# Patient Record
Sex: Female | Born: 1990 | Race: Black or African American | Hispanic: No | Marital: Single | Smoking: Never smoker
Health system: Southern US, Community
[De-identification: ages and names within clinical notes are randomized; demographics above are authoritative.]

## PROBLEM LIST (undated history)

## (undated) DIAGNOSIS — Z789 Other specified health status: Secondary | ICD-10-CM

## (undated) DIAGNOSIS — D649 Anemia, unspecified: Secondary | ICD-10-CM

## (undated) HISTORY — PX: NO PAST SURGERIES: SHX2092

---
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
## 2010-01-18 ENCOUNTER — Emergency Department (HOSPITAL_COMMUNITY): Admission: EM | Admit: 2010-01-18 | Discharge: 2010-01-18 | Payer: Self-pay | Admitting: Emergency Medicine

## 2012-08-13 ENCOUNTER — Emergency Department
Admission: EM | Admit: 2012-08-13 | Discharge: 2012-08-13 | Disposition: A | Discharge status: Home / Self Care | Payer: 59 | Source: Home / Self Care | Attending: Family Medicine | Admitting: Family Medicine

## 2012-08-13 ENCOUNTER — Encounter: Payer: Self-pay | Admitting: Emergency Medicine

## 2012-08-13 DIAGNOSIS — Z331 Pregnant state, incidental: Secondary | ICD-10-CM

## 2012-08-13 DIAGNOSIS — R109 Unspecified abdominal pain: Secondary | ICD-10-CM

## 2012-08-13 DIAGNOSIS — Z3490 Encounter for supervision of normal pregnancy, unspecified, unspecified trimester: Secondary | ICD-10-CM

## 2012-08-13 HISTORY — DX: Anemia, unspecified: D64.9

## 2012-08-13 LAB — POCT URINALYSIS DIP (MANUAL ENTRY)
Glucose, UA: NEGATIVE
Ketones, POC UA: NEGATIVE
Spec Grav, UA: 1.02 (ref 1.005–1.03)

## 2012-08-13 LAB — POCT URINE PREGNANCY: Preg Test, Ur: POSITIVE

## 2012-08-13 NOTE — ED Notes (Signed)
Reports lower left abdominal cramping, especially when lying on left side at night. LMP is 07-04-12 and there is a chance of pregnancy. No vaginal spotting.

## 2012-08-13 NOTE — ED Provider Notes (Signed)
History     CSN: [id number]  Arrival date & time 08/13/12  1909   First MD Initiated Contact with Patient 08/13/12 2012      Chief Complaint  Patient presents with  . Abdominal Cramping      HPI Comments: Reports lower left abdominal cramping for one week, especially when lying on left side at night. LMP is 07-04-12 and there is a chance of pregnancy. No vaginal spotting.  No dysuria.  No fevers, chills, and sweats   Patient is a 21 y.o. female presenting with abdominal pain. The history is provided by the patient.  Abdominal Pain The primary symptoms of the illness include abdominal pain. Episode onset: 1 week ago. The onset of the illness was gradual. The problem has not changed since onset. Associated with: nothing. The patient has not had a change in bowel habit. Additional symptoms associated with the illness include back pain. Symptoms associated with the illness do not include chills, anorexia, diaphoresis, heartburn, constipation, urgency, hematuria or frequency.    Past Medical History  Diagnosis Date  . Anemia     History reviewed. No pertinent past surgical history.  History reviewed. No pertinent family history.  History  Substance Use Topics  . Smoking status: Never Smoker   . Smokeless tobacco: Not on file  . Alcohol Use: Yes    OB History    Grav Para Term Preterm Abortions TAB SAB Ect Mult Living                  Review of Systems  Constitutional: Negative for chills and diaphoresis.  Gastrointestinal: Positive for abdominal pain. Negative for heartburn, constipation and anorexia.  Genitourinary: Negative for urgency, frequency and hematuria.  Musculoskeletal: Positive for back pain.  All other systems reviewed and are negative.    Allergies  Review of patient's allergies indicates no known allergies.  Home Medications   Current Outpatient Rx  Name Route Sig Dispense Refill  . FERROUS FUMARATE 325 (106 FE) MG PO TABS Oral Take 1 tablet by  mouth.      BP 101/67  Pulse 87  Temp 98.4 F (36.9 C) (Oral)  Resp 16  Ht 5\' 4"  (1.626 m)  Wt 146 lb (66.225 kg)  BMI 25.06 kg/m2  SpO2 100%  LMP 07/04/2012  Physical Exam Nursing notes and Vital Signs reviewed. Appearance:  Patient appears healthy, stated age, and in no acute distress Eyes:  Pupils are equal, round, and reactive to light and accomodation.  Extraocular movement is intact.  Conjunctivae are not inflamed   Pharynx:  Normal Neck:  Supple. No adenopathy  Lungs:  Clear to auscultation.  Breath sounds are equal.  Heart:  Regular rate and rhythm without murmurs, rubs, or gallops.  Abdomen:  Nontender without masses or hepatosplenomegaly.  Bowel sounds are present.  No CVA or flank tenderness.  Extremities:  No edema.  No calf tenderness Skin:  No rash present.   Back:  nontender ED Course  Procedures none    Labs Reviewed  URINE CULTURE pending   Narrative:    Performed at:  [facility]                [address]                [place], [place] [address]  POCT URINALYSIS DI P (MANUAL ENTRY) trace blood, trace leuks  POCT URINE PREGNANCY positive      1. Abdominal pain  2. Pregnancy       MDM  Patient has benign exam; suspect intrauterine pregnancy.  No evidence on exam of ectopic pregnancy or other pathologic conditions. Recommend that she followup with OB/GYN as soon as possible.  Discussed red flags:  Vaginal bleeding, increasing abdominal pain, fever, etc. Will send urine for culture.        Lattie Haw, MD 08/15/12 [phone]

## 2012-08-15 ENCOUNTER — Telehealth: Payer: Self-pay | Admitting: *Deleted

## 2012-08-16 ENCOUNTER — Telehealth: Payer: Self-pay | Admitting: Emergency Medicine

## 2012-08-16 LAB — URINE CULTURE: Colony Count: >=100000

## 2012-12-19 NOTE — L&D Delivery Note (Signed)
Patient was C/C/+2 and pushed for approx 1hr with epidural.   NSVD female infant, mild shoulder dystocia relieved with McRobert's and suprapubic pressure,  Apgars 8/9, weight pending.   The patient had a small 1st degree perineal repaired with 3-0 vicryl and  Bilateral vaginal wall tears each repaired with a figure of 8 Fundus was firm. EBL was expected. Placenta was delivered intact. Vagina was clear.  Baby was vigorous to bedside.  Cathy Brandt

## 2013-03-06 LAB — OB RESULTS CONSOLE RUBELLA ANTIBODY, IGM: Rubella: IMMUNE

## 2013-03-06 LAB — OB RESULTS CONSOLE RPR: RPR: NONREACTIVE

## 2013-03-06 LAB — OB RESULTS CONSOLE ABO/RH: "RH Type ": POSITIVE

## 2013-03-06 LAB — OB RESULTS CONSOLE ANTIBODY SCREEN: Antibody Screen: NEGATIVE

## 2013-03-06 LAB — OB RESULTS CONSOLE HEPATITIS B SURFACE ANTIGEN: Hepatitis B Surface Ag: NEGATIVE

## 2013-03-06 LAB — OB RESULTS CONSOLE HIV ANTIBODY (ROUTINE TESTING): HIV: NONREACTIVE

## 2013-08-21 LAB — OB RESULTS CONSOLE GC/CHLAMYDIA
Chlamydia: NEGATIVE
Gonorrhea: NEGATIVE

## 2013-08-21 LAB — OB RESULTS CONSOLE GBS: GBS: NEGATIVE

## 2013-09-17 ENCOUNTER — Encounter (HOSPITAL_COMMUNITY): Payer: Self-pay | Admitting: *Deleted

## 2013-09-17 ENCOUNTER — Inpatient Hospital Stay (HOSPITAL_COMMUNITY)
Admission: AD | Admit: 2013-09-17 | Discharge: 2013-09-19 | DRG: 775 | Disposition: A | Discharge status: Home / Self Care | Payer: Medicaid Other | Source: Ambulatory Visit | Attending: Obstetrics and Gynecology | Admitting: Obstetrics and Gynecology

## 2013-09-17 HISTORY — DX: Other specified health status: Z78.9

## 2013-09-17 LAB — CBC
HCT: 33.9 % — ABNORMAL LOW (ref 36.0–46.0)
Hemoglobin: 11.3 g/dL — ABNORMAL LOW (ref 12.0–15.0)
MCV: 91.9 fL (ref 78.0–100.0)
Platelets: 279 10*3/uL (ref 150–400)
RBC: 3.69 MIL/uL — ABNORMAL LOW (ref 3.87–5.11)
WBC: 8.2 10*3/uL (ref 4.0–10.5)

## 2013-09-17 LAB — POCT FERN TEST: POCT Fern Test: POSITIVE

## 2013-09-17 MED ORDER — LACTATED RINGERS IV SOLN: 500.0000 mL | INTRAVENOUS | Status: DC | PRN

## 2013-09-17 MED ORDER — EPHEDRINE 5 MG/ML INJ
10.0000 mg | INTRAVENOUS | Status: DC | PRN
  Filled 2013-09-17: qty 2
  Filled 2013-09-17: qty 4

## 2013-09-17 MED ORDER — EPHEDRINE 5 MG/ML INJ
10.0000 mg | INTRAVENOUS | Status: DC | PRN
  Filled 2013-09-17: qty 2

## 2013-09-17 MED ORDER — DIPHENHYDRAMINE HCL 50 MG/ML IJ SOLN: 12.5000 mg | INTRAMUSCULAR | Status: DC | PRN

## 2013-09-17 MED ORDER — LACTATED RINGERS IV SOLN
INTRAVENOUS | Status: DC
  Administered 2013-09-17 – 2013-09-18 (×2): via INTRAVENOUS

## 2013-09-17 MED ORDER — PHENYLEPHRINE 40 MCG/ML (10ML) SYRINGE FOR IV PUSH (FOR BLOOD PRESSURE SUPPORT)
80.0000 ug | PREFILLED_SYRINGE | INTRAVENOUS | Status: DC | PRN
  Filled 2013-09-17: qty 5
  Filled 2013-09-17: qty 2

## 2013-09-17 MED ORDER — OXYCODONE-ACETAMINOPHEN 5-325 MG PO TABS: 1.0000 | ORAL_TABLET | ORAL | Status: DC | PRN

## 2013-09-17 MED ORDER — PHENYLEPHRINE 40 MCG/ML (10ML) SYRINGE FOR IV PUSH (FOR BLOOD PRESSURE SUPPORT)
80.0000 ug | PREFILLED_SYRINGE | INTRAVENOUS | Status: DC | PRN
  Filled 2013-09-17: qty 2

## 2013-09-17 MED ORDER — CITRIC ACID-SODIUM CITRATE 334-500 MG/5ML PO SOLN: 30.0000 mL | ORAL | Status: DC | PRN

## 2013-09-17 MED ORDER — ACETAMINOPHEN 325 MG PO TABS: 650.0000 mg | ORAL_TABLET | ORAL | Status: DC | PRN

## 2013-09-17 MED ORDER — OXYTOCIN BOLUS FROM INFUSION
500.0000 mL | INTRAVENOUS | Status: DC
  Administered 2013-09-18: 500 mL via INTRAVENOUS

## 2013-09-17 MED ORDER — TERBUTALINE SULFATE 1 MG/ML IJ SOLN: 0.2500 mg | Freq: Once | INTRAMUSCULAR | Status: AC | PRN

## 2013-09-17 MED ORDER — LIDOCAINE HCL (PF) 1 % IJ SOLN
30.0000 mL | INTRAMUSCULAR | Status: DC | PRN
  Filled 2013-09-17 (×2): qty 30

## 2013-09-17 MED ORDER — OXYTOCIN 40 UNITS IN LACTATED RINGERS INFUSION - SIMPLE MED
1.0000 m[IU]/min | INTRAVENOUS | Status: DC
  Administered 2013-09-18: 1 m[IU]/min via INTRAVENOUS
  Filled 2013-09-17: qty 1000

## 2013-09-17 MED ORDER — LACTATED RINGERS IV SOLN
500.0000 mL | Freq: Once | INTRAVENOUS | Status: AC
  Administered 2013-09-18: 500 mL via INTRAVENOUS

## 2013-09-17 MED ORDER — ONDANSETRON HCL 4 MG/2ML IJ SOLN: 4.0000 mg | Freq: Four times a day (QID) | INTRAMUSCULAR | Status: DC | PRN

## 2013-09-17 MED ORDER — FENTANYL 2.5 MCG/ML BUPIVACAINE 1/10 % EPIDURAL INFUSION (WH - ANES)
14.0000 mL/h | INTRAMUSCULAR | Status: DC | PRN
  Administered 2013-09-18: 14 mL/h via EPIDURAL
  Filled 2013-09-17: qty 125

## 2013-09-17 MED ORDER — IBUPROFEN 600 MG PO TABS: 600.0000 mg | ORAL_TABLET | Freq: Four times a day (QID) | ORAL | Status: DC | PRN

## 2013-09-17 MED ORDER — FLEET ENEMA 7-19 GM/118ML RE ENEM: 1.0000 | ENEMA | RECTAL | Status: DC | PRN

## 2013-09-17 MED ORDER — OXYTOCIN 40 UNITS IN LACTATED RINGERS INFUSION - SIMPLE MED
62.5000 mL/h | INTRAVENOUS | Status: DC
  Administered 2013-09-18: 62.5 mL/h via INTRAVENOUS

## 2013-09-17 NOTE — MAU Note (Signed)
Dr. Claiborne Billings notified of pt, orders rec'd.

## 2013-09-17 NOTE — MAU Note (Signed)
Pt G1 at 40.1wks, leaking clear fluid since 2100.  Denies any problems with pregnancy.  No bleeding.

## 2013-09-18 ENCOUNTER — Encounter (HOSPITAL_COMMUNITY): Payer: Self-pay | Admitting: *Deleted

## 2013-09-18 ENCOUNTER — Encounter (HOSPITAL_COMMUNITY): Payer: Self-pay | Admitting: Anesthesiology

## 2013-09-18 ENCOUNTER — Inpatient Hospital Stay (HOSPITAL_COMMUNITY): Payer: Medicaid Other | Admitting: Anesthesiology

## 2013-09-18 MED ORDER — LANOLIN HYDROUS EX OINT: TOPICAL_OINTMENT | CUTANEOUS | Status: DC | PRN

## 2013-09-18 MED ORDER — DIBUCAINE 1 % RE OINT: 1.0000 "application " | TOPICAL_OINTMENT | RECTAL | Status: DC | PRN

## 2013-09-18 MED ORDER — ONDANSETRON HCL 4 MG/2ML IJ SOLN: 4.0000 mg | INTRAMUSCULAR | Status: DC | PRN

## 2013-09-18 MED ORDER — WITCH HAZEL-GLYCERIN EX PADS: 1.0000 "application " | MEDICATED_PAD | CUTANEOUS | Status: DC | PRN

## 2013-09-18 MED ORDER — SENNOSIDES-DOCUSATE SODIUM 8.6-50 MG PO TABS
2.0000 | ORAL_TABLET | ORAL | Status: DC
  Administered 2013-09-19: 2 via ORAL

## 2013-09-18 MED ORDER — ZOLPIDEM TARTRATE 5 MG PO TABS: 5.0000 mg | ORAL_TABLET | Freq: Every evening | ORAL | Status: DC | PRN

## 2013-09-18 MED ORDER — OXYCODONE-ACETAMINOPHEN 5-325 MG PO TABS: 1.0000 | ORAL_TABLET | ORAL | Status: DC | PRN

## 2013-09-18 MED ORDER — IBUPROFEN 600 MG PO TABS
600.0000 mg | ORAL_TABLET | Freq: Four times a day (QID) | ORAL | Status: DC
  Administered 2013-09-18 – 2013-09-19 (×6): 600 mg via ORAL
  Filled 2013-09-18 (×7): qty 1

## 2013-09-18 MED ORDER — DIPHENHYDRAMINE HCL 25 MG PO CAPS: 25.0000 mg | ORAL_CAPSULE | Freq: Four times a day (QID) | ORAL | Status: DC | PRN

## 2013-09-18 MED ORDER — ONDANSETRON HCL 4 MG PO TABS: 4.0000 mg | ORAL_TABLET | ORAL | Status: DC | PRN

## 2013-09-18 MED ORDER — PRENATAL MULTIVITAMIN CH
1.0000 | ORAL_TABLET | Freq: Every day | ORAL | Status: DC
  Administered 2013-09-18 – 2013-09-19 (×2): 1 via ORAL
  Filled 2013-09-18 (×2): qty 1

## 2013-09-18 MED ORDER — SIMETHICONE 80 MG PO CHEW: 80.0000 mg | CHEWABLE_TABLET | ORAL | Status: DC | PRN

## 2013-09-18 MED ORDER — LIDOCAINE HCL (PF) 1 % IJ SOLN
INTRAMUSCULAR | Status: DC | PRN
  Administered 2013-09-18 (×4): 4 mL

## 2013-09-18 MED ORDER — TETANUS-DIPHTH-ACELL PERTUSSIS 5-2.5-18.5 LF-MCG/0.5 IM SUSP
0.5000 mL | Freq: Once | INTRAMUSCULAR | Status: AC
  Administered 2013-09-18: 0.5 mL via INTRAMUSCULAR
  Filled 2013-09-18: qty 0.5

## 2013-09-18 MED ORDER — BENZOCAINE-MENTHOL 20-0.5 % EX AERO
1.0000 "application " | INHALATION_SPRAY | CUTANEOUS | Status: DC | PRN
  Administered 2013-09-18: 1 via TOPICAL
  Filled 2013-09-18: qty 56

## 2013-09-18 NOTE — Anesthesia Preprocedure Evaluation (Signed)

## 2013-09-18 NOTE — Progress Notes (Signed)
Ur chart review completed.  

## 2013-09-18 NOTE — Anesthesia Procedure Notes (Signed)
Epidural Patient location during procedure: OB Start time: 09/18/2013 2:57 AM  Staffing Performed by: anesthesiologist   Preanesthetic Checklist Completed: patient identified, site marked, surgical consent, pre-op evaluation, timeout performed, IV checked, risks and benefits discussed and monitors and equipment checked  Epidural Patient position: sitting Prep: site prepped and draped and DuraPrep Patient monitoring: continuous pulse ox and blood pressure Approach: midline Injection technique: LOR air  Needle:  Needle type: Tuohy  Needle gauge: 17 G Needle length: 9 cm and 9 Needle insertion depth: 5 cm cm Catheter type: closed end flexible Catheter size: 19 Gauge Catheter at skin depth: 10 cm Test dose: negative  Assessment Events: blood not aspirated, injection not painful, no injection resistance, negative IV test and no paresthesia  Additional Notes Discussed risk of headache, infection, bleeding, nerve injury and failed or incomplete block.  Patient voices understanding and wishes to proceed.   Epidural placed easily on first attempt.  No paresthesia.  Patient tolerated procedure well with no apparent complications.  Jasmine December, MDReason for block:procedure for pain

## 2013-09-18 NOTE — Anesthesia Postprocedure Evaluation (Signed)
Anesthesia Post Note  Patient: Cathy Brandt  Procedure(s) Performed: * No procedures listed *  Anesthesia type: Epidural  Patient location: Mother/Baby  Post pain: Pain level controlled  Post assessment: Post-op Vital signs reviewed  Last Vitals:  Filed Vitals:   09/18/13 0632  BP: 101/80  Pulse: 85  Temp:   Resp: 18    Post vital signs: Reviewed  Level of consciousness:alert  Complications: No apparent anesthesia complications

## 2013-09-18 NOTE — H&P (Signed)
22 y.o. [redacted]w[redacted]d  G2P0010 comes in c/o with LOF since 9pm.  Otherwise has good fetal movement and no bleeding.  Past Medical History  Diagnosis Date  . Anemia   . Medical history non-contributory     Past Surgical History  Procedure Laterality Date  . No past surgeries      OB History  Gravida Para Term Preterm AB SAB TAB Ectopic Multiple Living  2    1 1     0    # Outcome Date GA Lbr Len/2nd Weight Sex Delivery Anes PTL Lv  2 CUR           1 SAB               History   Social History  . Marital Status: Single    Spouse Name: N/A    Number of Children: N/A  . Years of Education: N/A   Occupational History  . Not on file.   Social History Main Topics  . Smoking status: Never Smoker   . Smokeless tobacco: Not on file  . Alcohol Use: No  . Drug Use: No  . Sexual Activity: No   Other Topics Concern  . Not on file   Social History Narrative  . No narrative on file   Review of patient's allergies indicates no known allergies.    Prenatal Transfer Tool  Maternal Diabetes: No Genetic Screening: Normal Maternal Ultrasounds/Referrals: Normal Fetal Ultrasounds or other Referrals:  None Maternal Substance Abuse:  No Significant Maternal Medications:  None Significant Maternal Lab Results: Lab values include: Group B Strep negative  Other PNC:    Filed Vitals:   09/17/13 2308  BP: 116/80  Pulse: 87  Temp: 98.4 F (36.9 C)  Resp: 18     Lungs/Cor:  NAD Abdomen:  soft, gravid Ex:  no cords, erythema SVE:  3/60/-2 at admission, now 3/70/-2 FHTs:  125, good STV, NST R Toco:  2-3   A/P   Admit with SROM  GBS Neg  Recheck of cervix at 12:40am shows only slight thinning of cervix, no further dilation.  Patient does not want epidual (ordered in case she changes her mind).  She is not wanting pitocin, but after discussion of infection risk she agrees but requests it be started slowly.  Discussed with RN will start 1x1 up to 4, then increase to 2x2.  Philip Aspen

## 2013-09-19 LAB — ABO/RH: ABO/RH(D): O POS

## 2013-09-19 LAB — CBC
Hemoglobin: 9.6 g/dL — ABNORMAL LOW (ref 12.0–15.0)
MCH: 30.4 pg (ref 26.0–34.0)
MCHC: 33.7 g/dL (ref 30.0–36.0)
Platelets: 216 10*3/uL (ref 150–400)
WBC: 12.7 10*3/uL — ABNORMAL HIGH (ref 4.0–10.5)

## 2013-09-19 NOTE — Lactation Note (Signed)
This note was copied from the chart of Cathy Jaselle Ilyas. Lactation Consultation Note    Follow up consult with this first time mom, now 30 hours post partum, and being dischaged to home with aby today. The baby latches well with strong suckles. I showed mom how to ist backm position herself and baby for cross cradle hold, and to bring the baby to her for a deep latch. Mom reports this latch feeling better than prios. Mom knows to call lactation for questions/concerns, and o/p consults prn. Breast feeding pabes of baby and me book reviewed with mom also.  Patient Name: Cathy Brandt [id number] Date: 09/19/2013 Reason for consult: Follow-up assessment   Maternal Data    Feeding Feeding Type: Breast Milk  LATCH Score/Interventions Latch: Grasps breast easily, tongue down, lips flanged, rhythmical sucking.  Audible Swallowing: None Intervention(s): Skin to skin  Type of Nipple: Everted at rest and after stimulation  Comfort (Breast/Nipple): Soft / non-tender     Hold (Positioning): Assistance needed to correctly position infant at breast and maintain latch. Intervention(s): Breastfeeding basics reviewed;Support Pillows;Position options;Skin to skin  LATCH Score: 7  Lactation Tools Discussed/Used     Consult Status Consult Status: Complete Follow-up type: Call as needed    Alfred Levins 09/19/2013, 12:16 PM

## 2013-09-19 NOTE — Progress Notes (Signed)
Patient is eating, ambulating, voiding.  Pain control is good.  Filed Vitals:   09/18/13 1220 09/18/13 1815 09/18/13 2000 09/19/13 0545  BP: 102/62 101/69 92/66 97/63   Pulse: 83 66 67 62  Temp: 98.4 F (36.9 C) 98.1 F (36.7 C) 98.2 F (36.8 C) 97.8 F (36.6 C)  TempSrc: Oral Oral Oral Oral  Resp: 18  20 18   Height:      Weight:      SpO2: 100% 100% 97%     Fundus firm Perineum without swelling.  Lab Results  Component Value Date   WBC 12.7* 09/19/2013   HGB 9.6* 09/19/2013   HCT 28.5* 09/19/2013   MCV 90.2 09/19/2013   PLT 216 09/19/2013    O/Positive/-- (03/19 0000)/RI  A/P Post partum day 1.  Routine care.  Expect d/c today.    Royce Sciara A

## 2013-09-24 ENCOUNTER — Inpatient Hospital Stay (HOSPITAL_COMMUNITY): Admission: RE | Admit: 2013-09-24 | Payer: 59 | Source: Ambulatory Visit

## 2013-09-28 NOTE — Discharge Summary (Signed)
Obstetric Discharge Summary Reason for Admission: rupture of membranes Prenatal Procedures: none Intrapartum Procedures: spontaneous vaginal delivery Postpartum Procedures: none Complications-Operative and Postpartum: vaginal laceration Hemoglobin  Date Value Range Status  09/19/2013 9.6* 12.0 - 15.0 g/dL Final     HCT  Date Value Range Status  09/19/2013 28.5* 36.0 - 46.0 % Final    Discharge Diagnoses: Term Pregnancy-delivered  Discharge Information: Date: 09/28/2013 Activity: pelvic rest Diet: routine Medications: Ibuprofen Condition: stable Instructions: refer to practice specific booklet Discharge to: home   Newborn Data: Live born female  Birth Weight: 7 lb 3.9 oz (3285 g) APGAR: 8, 9  Home with mother.  Dalaina Tates A 09/28/2013, 9:27 AM

## 2014-10-20 ENCOUNTER — Encounter (HOSPITAL_COMMUNITY): Payer: Self-pay | Admitting: *Deleted

## 2016-02-17 ENCOUNTER — Encounter (HOSPITAL_COMMUNITY): Payer: Self-pay | Admitting: Emergency Medicine

## 2016-02-17 ENCOUNTER — Emergency Department (INDEPENDENT_AMBULATORY_CARE_PROVIDER_SITE_OTHER)
Admission: EM | Admit: 2016-02-17 | Discharge: 2016-02-17 | Disposition: A | Discharge status: Home / Self Care | Payer: Medicaid Other | Source: Home / Self Care | Attending: Family Medicine | Admitting: Family Medicine

## 2016-02-17 DIAGNOSIS — K0889 Other specified disorders of teeth and supporting structures: Secondary | ICD-10-CM

## 2016-02-17 MED ORDER — IBUPROFEN 800 MG PO TABS: 800.0000 mg | ORAL_TABLET | Freq: Three times a day (TID) | ORAL | Status: DC

## 2016-02-17 NOTE — ED Notes (Signed)
Pt is experiencing a dull pain in her right lower tooth after having a filling placed today.  She also reports shooting pains.  They state her dentist was already closed for the day when she started experiencing the pain.

## 2016-02-17 NOTE — Discharge Instructions (Signed)
Call your dentist tomorrow

## 2016-02-17 NOTE — ED Provider Notes (Signed)
CSN: [id number]     Arrival date & time 02/17/16  1826 History   First MD Initiated Contact with Patient 02/17/16 1921     Chief Complaint  Patient presents with  . Dental Pain   (Consider location/radiation/quality/duration/timing/severity/associated sxs/prior Treatment) HPI Comments: 25 year old female went to the dentist today and had a filling in the right lower bicuspid. She states the numbness is wearing off and she is having a toothache. She has taken Tylenol and she still has pain. She tried calling her dentist but they have since closed.   Past Medical History  Diagnosis Date  . Anemia   . Medical history non-contributory    Past Surgical History  Procedure Laterality Date  . No past surgeries     History reviewed. No pertinent family history. Social History  Substance Use Topics  . Smoking status: Never Smoker   . Smokeless tobacco: None  . Alcohol Use: No   OB History    Gravida Para Term Preterm AB TAB SAB Ectopic Multiple Living   Review of Systems  Constitutional: Negative for activity change.  HENT: Positive for dental problem.   Respiratory: Negative.   All other systems reviewed and are negative.   Allergies  Review of patient's allergies indicates no known allergies.  Home Medications   Prior to Admission medications   Medication Sig Start Date End Date Taking? Authorizing Provider  ferrous fumarate (HEMOCYTE - 106 MG FE) 325 (106 FE) MG TABS Take 1 tablet by mouth.    Historical Provider, MD  ibuprofen (ADVIL,MOTRIN) 800 MG tablet Take 1 tablet (800 mg total) by mouth 3 (three) times daily. 02/17/16   Hayden Rasmussen, NP  prenatal vitamin w/FE, FA (NATACHEW) 29-1 MG CHEW chewable tablet Chew 1 tablet by mouth daily at 12 noon.    Historical Provider, MD   Meds Ordered and Administered this Visit  Medications - No data to display  BP 122/77 mmHg  Pulse 72  Temp(Src) 98 F (36.7 C) (Oral)  Resp 16  SpO2 100%  LMP 02/03/2016  (Within Days) No data found.   Physical Exam  Constitutional: She appears well-developed and well-nourished. No distress.  HENT:  Mouth/Throat: No oropharyngeal exudate.  There is dental tenderness to the right lower bicuspid. The filling must have been same color as enamel and is difficult to distinguish between the existing enamel and the new filling. The molars further back have metallic feeling and are obviously well seen. There is dental tenderness to the bicuspid. No swelling, redness bleeding or other abnormality.  Eyes: EOM are normal.  Neck: Normal range of motion. Neck supple.  Pulmonary/Chest: Effort normal.  Neurological: She is alert. She exhibits normal muscle tone.  Skin: Skin is warm and dry.  Psychiatric: She has a normal mood and affect.  Nursing note and vitals reviewed.   ED Course  Procedures (including critical care time)  Labs Review Labs Reviewed - No data to display  Imaging Review No results found.   Visual Acuity Review  Right Eye Distance:   Left Eye Distance:   Bilateral Distance:    Right Eye Near:   Left Eye Near:    Bilateral Near:         MDM   1. Pain, dental    Call your dentist tomorrow Ibuprofen 800 tid prn    Hayden Rasmussen, NP 02/17/16 1946

## 2018-01-02 ENCOUNTER — Ambulatory Visit
Admission: EM | Admit: 2018-01-02 | Discharge: 2018-01-02 | Disposition: A | Discharge status: Home / Self Care | Payer: Self-pay | Attending: Family Medicine | Admitting: Family Medicine

## 2018-01-02 ENCOUNTER — Other Ambulatory Visit: Payer: Self-pay

## 2018-01-02 DIAGNOSIS — R300 Dysuria: Secondary | ICD-10-CM

## 2018-01-02 DIAGNOSIS — N3001 Acute cystitis with hematuria: Secondary | ICD-10-CM

## 2018-01-02 LAB — URINALYSIS, COMPLETE (UACMP) WITH MICROSCOPIC
BILIRUBIN URINE: NEGATIVE
Glucose, UA: NEGATIVE mg/dL
Ketones, ur: NEGATIVE mg/dL
NITRITE: NEGATIVE
Protein, ur: NEGATIVE mg/dL
Specific Gravity, Urine: 1.02 (ref 1.005–1.030)
pH: 6.5 (ref 5.0–8.0)

## 2018-01-02 MED ORDER — CEPHALEXIN 500 MG PO CAPS: 500.0000 mg | ORAL_CAPSULE | Freq: Two times a day (BID) | ORAL | 0 refills | Status: DC

## 2018-01-02 NOTE — ED Triage Notes (Signed)
Pt reports frequent UTIs and "I can tell when I have them." Reports 8 days of cloudy urine, strong smell, and burns at the end of her urine stream

## 2018-01-02 NOTE — ED Provider Notes (Signed)
[facility]    CSN: [id number] Arrival date & time: 01/02/18  1807  History   Chief Complaint Chief Complaint  Patient presents with  . Urinary Tract Infection   HPI  27 year old female presents with concerns for UTI.  Patient reports a 8-day history of cloudy and malodorous urine.  She states that she has had some dysuria at the end of her stream.  No frequency.  No fevers or chills.  No flank pain or back pain.  No abdominal pain.  No known exacerbating relieving factors.  No other associated symptoms.  Currently pain-free.  No other complaints or concerns at this time.  Past Medical History:  Diagnosis Date  . Anemia   . Medical history non-contributory     Patient Active Problem List   Diagnosis Date Noted  . 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 NO PAST SURGERIES      OB History    Gravida Para Term Preterm AB Living   2 1 1   1 1    SAB TAB Ectopic Multiple Live Births   1       1      Home Medications    Prior to Admission medications   Medication Sig Start Date End Date Taking? Authorizing Provider  cephALEXin (KEFLEX) 500 MG capsule Take 1 capsule (500 mg total) by mouth 2 (two) times daily. 01/02/18   Tommie Sams, DO  ferrous fumarate (HEMOCYTE - 106 MG FE) 325 (106 FE) MG TABS Take 1 tablet by mouth.    [provider]  ibuprofen (ADVIL,MOTRIN) 800 MG tablet Take 1 tablet (800 mg total) by mouth 3 (three) times daily. 02/17/16   Hayden Rasmussen, NP    Family History Family History  Problem Relation Age of Onset  . Diabetes Mother   . Healthy Father     Social History Social History   Tobacco Use  . Smoking status: Never Smoker  . Smokeless tobacco: Never Used  Substance Use Topics  . Alcohol use: Yes    Comment: rare  . Drug use: No     Allergies   Patient has no known allergies.   Review of Systems Review of Systems  Constitutional: Negative.   Gastrointestinal: Negative.     Genitourinary: Positive for dysuria. Negative for flank pain and frequency.       Cloudy urine with odor.   Physical Exam Triage Vital Signs ED Triage Vitals  Enc Vitals Group     BP 01/02/18 1824 106/75     Pulse Rate 01/02/18 1824 73     Resp 01/02/18 1824 16     Temp 01/02/18 1824 97.8 F (36.6 C)     Temp Source 01/02/18 1824 Oral     SpO2 01/02/18 1824 100 %     Weight 01/02/18 1821 136 lb (61.7 kg)     Height 01/02/18 1821 5\' 4"  (1.626 m)     Head Circumference --      Peak Flow --      Pain Score 01/02/18 1853 0     Pain Loc --      Pain Edu? --      Excl. in GC? --    Updated Vital Signs BP 106/75 (BP Location: Right Arm)   Pulse 73   Temp 97.8 F (36.6 C) (Oral)   Resp 16   Ht 5\' 4"  (1.626 m)   Wt 136 lb (61.7 kg)   LMP  12/19/2017   SpO2 100%   BMI 23.34 kg/m   Physical Exam  Constitutional: She is oriented to person, place, and time. She appears well-developed. No distress.  Cardiovascular: Normal rate and regular rhythm.  No murmur heard. Pulmonary/Chest: Effort normal and breath sounds normal. She has no wheezes. She has no rales.  Abdominal: Soft. She exhibits no distension. There is no tenderness.  Neurological: She is alert and oriented to person, place, and time.  Psychiatric: She has a normal mood and affect. Her behavior is normal.  Nursing note and vitals reviewed.  UC Treatments / Results  Labs (all labs ordered are listed, but only abnormal results are displayed) Labs Reviewed  URINALYSIS, COMPLETE (UACMP) WITH MICROSCOPIC - Abnormal; Notable for the following components:      Result Value   APPearance CLOUDY (*)    Hgb urine dipstick MODERATE (*)    Leukocytes, UA SMALL (*)    Squamous Epithelial / LPF 6-30 (*)    Bacteria, UA MANY (*)    All other components within normal limits  URINE CULTURE    EKG  EKG Interpretation None       Radiology No results found.  Procedures Procedures (including critical care  time)  Medications Ordered in UC Medications - No data to display   Initial Impression / Assessment and Plan / UC Course  I have reviewed the triage vital signs and the nursing notes.  Pertinent labs & imaging results that were available during my care of the patient were reviewed by me and considered in my medical decision making (see chart for details).     27 year old female presents with UTI.  Treating with Keflex.  Sending culture.  Final Clinical Impressions(s) / UC Diagnoses   Final diagnoses:  Acute cystitis with hematuria    ED Discharge Orders        Ordered    cephALEXin (KEFLEX) 500 MG capsule  2 times daily     01/02/18 1849     Controlled Substance Prescriptions [place] Controlled Substance Registry consulted? Not Applicable   Tommie SamsCook, Lailana Shira G, DO 01/02/18 1954

## 2018-01-05 LAB — URINE CULTURE: Culture: >=100000 — AB

## 2018-02-12 ENCOUNTER — Encounter: Payer: Self-pay | Admitting: Emergency Medicine

## 2018-02-12 ENCOUNTER — Ambulatory Visit
Admission: EM | Admit: 2018-02-12 | Discharge: 2018-02-12 | Disposition: A | Discharge status: Home / Self Care | Payer: Self-pay | Attending: Family Medicine | Admitting: Family Medicine

## 2018-02-12 ENCOUNTER — Other Ambulatory Visit: Payer: Self-pay

## 2018-02-12 DIAGNOSIS — N3001 Acute cystitis with hematuria: Secondary | ICD-10-CM

## 2018-02-12 LAB — URINALYSIS, COMPLETE (UACMP) WITH MICROSCOPIC
Bilirubin Urine: NEGATIVE
Glucose, UA: NEGATIVE mg/dL
Ketones, ur: NEGATIVE mg/dL
LEUKOCYTES UA: NEGATIVE
NITRITE: NEGATIVE
PH: 7 (ref 5.0–8.0)
PROTEIN: NEGATIVE mg/dL
Specific Gravity, Urine: 1.02 (ref 1.005–1.030)

## 2018-02-12 MED ORDER — CEPHALEXIN 500 MG PO CAPS: 500.0000 mg | ORAL_CAPSULE | Freq: Two times a day (BID) | ORAL | 0 refills | Status: AC

## 2018-02-12 NOTE — ED Provider Notes (Signed)
[facility]    CSN: [id number] Arrival date & time: 02/12/18  0904     History   Chief Complaint Chief Complaint  Patient presents with  . Recurrent UTI    HPI Cathy Brandt is a 27 y.o. female.   27 yo female with a c/o "strong urine odor" and slight discomfort with urination but "not pain". Denies any fevers, chills, vomiting, abdominal or back pain. Was treated last month for a UTI and patient states symptoms improved but didn't seem to completely resolve.    The history is provided by the patient.    Past Medical History:  Diagnosis Date  . Anemia   . Medical history non-contributory     Patient Active Problem List   Diagnosis Date Noted  . 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 NO PAST SURGERIES      OB History    Gravida Para Term Preterm AB Living   2 1 1   1 1    SAB TAB Ectopic Multiple Live Births   1       1       Home Medications    Prior to Admission medications   Medication Sig Start Date End Date Taking? Authorizing Provider  cephALEXin (KEFLEX) 500 MG capsule Take 1 capsule (500 mg total) by mouth 2 (two) times daily. 02/12/18   Payton Mccallum, MD  ferrous fumarate (HEMOCYTE - 106 MG FE) 325 (106 FE) MG TABS Take 1 tablet by mouth.    [provider]  ibuprofen (ADVIL,MOTRIN) 800 MG tablet Take 1 tablet (800 mg total) by mouth 3 (three) times daily. 02/17/16   Hayden Rasmussen, NP    Family History Family History  Problem Relation Age of Onset  . Diabetes Mother   . Healthy Father     Social History Social History   Tobacco Use  . Smoking status: Never Smoker  . Smokeless tobacco: Never Used  Substance Use Topics  . Alcohol use: Yes    Comment: rare  . Drug use: No     Allergies   Patient has no known allergies.   Review of Systems Review of Systems   Physical Exam Triage Vital Signs ED Triage Vitals  Enc Vitals Group     BP 02/12/18 1034 100/60     Pulse Rate  02/12/18 1034 68     Resp 02/12/18 1034 16     Temp 02/12/18 1034 97.9 F (36.6 C)     Temp Source 02/12/18 1034 Oral     SpO2 02/12/18 1034 100 %     Weight 02/12/18 1032 136 lb (61.7 kg)     Height 02/12/18 1032 5\' 4"  (1.626 m)     Head Circumference --      Peak Flow --      Pain Score 02/12/18 1032 0     Pain Loc --      Pain Edu? --      Excl. in GC? --    No data found.  Updated Vital Signs BP 100/60   Pulse 68   Temp 97.9 F (36.6 C) (Oral)   Resp 16   Ht 5\' 4"  (1.626 m)   Wt 136 lb (61.7 kg)   LMP 02/12/2018 (Exact Date)   SpO2 100%   BMI 23.34 kg/m   Visual Acuity Right Eye Distance:   Left Eye Distance:   Bilateral Distance:    Right Eye Near:   Left Eye  Near:    Bilateral Near:     Physical Exam  Constitutional: She appears well-developed and well-nourished. No distress.  Abdominal: Soft. She exhibits no distension.  Skin: She is not diaphoretic.  Nursing note and vitals reviewed.    UC Treatments / Results  Labs (all labs ordered are listed, but only abnormal results are displayed) Labs Reviewed  URINALYSIS, COMPLETE (UACMP) WITH MICROSCOPIC - Abnormal; Notable for the following components:      Result Value   Hgb urine dipstick MODERATE (*)    Squamous Epithelial / LPF 6-30 (*)    Bacteria, UA FEW (*)    All other components within normal limits  URINE CULTURE    EKG  EKG Interpretation None       Radiology No results found.  Procedures Procedures (including critical care time)  Medications Ordered in UC Medications - No data to display   Initial Impression / Assessment and Plan / UC Course  I have reviewed the triage vital signs and the nursing notes.  Pertinent labs & imaging results that were available during my care of the patient were reviewed by me and considered in my medical decision making (see chart for details).       Final Clinical Impressions(s) / UC Diagnoses   Final diagnoses:  Acute cystitis with  hematuria    ED Discharge Orders        Ordered    cephALEXin (KEFLEX) 500 MG capsule  2 times daily     02/12/18 1109     1. Lab results and diagnosis reviewed with patient 2. rx as per orders above; reviewed possible side effects, interactions, risks and benefits  3. Recommend supportive treatment with increased fluids 4. Follow-up prn if symptoms worsen or don't improve  Controlled Substance Prescriptions [place] Controlled Substance Registry consulted? Not Applicable   Payton Mccallumonty, Jaicee Michelotti, MD 02/12/18 [phone]

## 2018-02-12 NOTE — ED Triage Notes (Signed)
Patient states her urine has a strong odor but she is not having painful urination.  Patient states she was treated a couple of weeks ago for a UTI but never got better.

## 2018-02-13 LAB — URINE CULTURE: Special Requests: NORMAL

## 2018-07-25 ENCOUNTER — Ambulatory Visit
Admission: EM | Admit: 2018-07-25 | Discharge: 2018-07-25 | Disposition: A | Discharge status: Home / Self Care | Payer: 59 | Attending: Family Medicine | Admitting: Family Medicine

## 2018-07-25 DIAGNOSIS — J209 Acute bronchitis, unspecified: Secondary | ICD-10-CM

## 2018-07-25 DIAGNOSIS — R05 Cough: Secondary | ICD-10-CM | POA: Diagnosis not present

## 2018-07-25 DIAGNOSIS — R059 Cough, unspecified: Secondary | ICD-10-CM

## 2018-07-25 MED ORDER — AZITHROMYCIN 250 MG PO TABS: ORAL_TABLET | ORAL | 0 refills | Status: AC

## 2018-07-25 NOTE — ED Triage Notes (Signed)
Chest tightness, chest congestion, dry cough, shortness of breath, HA onset week.

## 2018-07-25 NOTE — ED Provider Notes (Signed)
[facility]    CSN: [id number] Arrival date & time: 07/25/18  1736     History   Chief Complaint Chief Complaint  Patient presents with  . Shortness of Breath    HPI Rodman PickleBreana Brandt is a 27 y.o. female.   The history is provided by the patient.  URI  Presenting symptoms: congestion and cough   Severity:  Moderate Onset quality:  Sudden Duration:  1 week Timing:  Constant Progression:  Worsening Chronicity:  New Relieved by:  None tried Ineffective treatments:  None tried Associated symptoms: headaches   Associated symptoms: no sinus pain and no wheezing   Risk factors: sick contacts   Risk factors: not elderly, no chronic cardiac disease, no chronic kidney disease, no chronic respiratory disease, no diabetes mellitus, no immunosuppression, no recent illness and no recent travel     Past Medical History:  Diagnosis Date  . Anemia   . Medical history non-contributory     Patient Active Problem List   Diagnosis Date Noted  . 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 NO PAST SURGERIES      OB History    Gravida  2   Para  1   Term  1   Preterm      AB  1   Living  1     SAB  1   TAB      Ectopic      Multiple      Live Births  1            Home Medications    Prior to Admission medications   Medication Sig Start Date End Date Taking? Authorizing Provider  azithromycin (ZITHROMAX Z-PAK) 250 MG tablet 2 tabs po once, then 1 tab po qd for next 4 days 07/25/18   Payton Mccallumonty, Shawne Eskelson, MD  cephALEXin (KEFLEX) 500 MG capsule Take 1 capsule (500 mg total) by mouth 2 (two) times daily. 02/12/18   Payton Mccallumonty, Nella Botsford, MD  ferrous fumarate (HEMOCYTE - 106 MG FE) 325 (106 FE) MG TABS Take 1 tablet by mouth.    [provider]  ibuprofen (ADVIL,MOTRIN) 800 MG tablet Take 1 tablet (800 mg total) by mouth 3 (three) times daily. 02/17/16   Hayden RasmussenMabe, David, NP    Family History Family History  Problem Relation  Age of Onset  . Diabetes Mother   . Healthy Father     Social History Social History   Tobacco Use  . Smoking status: Never Smoker  . Smokeless tobacco: Never Used  Substance Use Topics  . Alcohol use: Yes    Comment: rare  . Drug use: No     Allergies   Patient has no known allergies.   Review of Systems Review of Systems  HENT: Positive for congestion. Negative for sinus pain.   Respiratory: Positive for cough. Negative for wheezing.   Neurological: Positive for headaches.     Physical Exam Triage Vital Signs ED Triage Vitals  Enc Vitals Group     BP 07/25/18 1759 95/71     Pulse Rate 07/25/18 1759 83     Resp 07/25/18 1759 16     Temp 07/25/18 1759 98.6 F (37 C)     Temp Source 07/25/18 1759 Oral     SpO2 07/25/18 1759 100 %     Weight 07/25/18 1757 139 lb (63 kg)     Height 07/25/18 1757 5\' 4"  (1.626 m)  Head Circumference --      Peak Flow --      Pain Score 07/25/18 1757 1     Pain Loc --      Pain Edu? --      Excl. in GC? --    No data found.  Updated Vital Signs BP 95/71 (BP Location: Left Arm)   Pulse 83   Temp 98.6 F (37 C) (Oral)   Resp 16   Ht 5\' 4"  (1.626 m)   Wt 139 lb (63 kg)   SpO2 100%   BMI 23.86 kg/m   Visual Acuity Right Eye Distance:   Left Eye Distance:   Bilateral Distance:    Right Eye Near:   Left Eye Near:    Bilateral Near:     Physical Exam  Constitutional: She appears well-developed and well-nourished. No distress.  HENT:  Head: Normocephalic and atraumatic.  Right Ear: Tympanic membrane, external ear and ear canal normal.  Left Ear: Tympanic membrane, external ear and ear canal normal.  Nose: No mucosal edema, rhinorrhea, nose lacerations, sinus tenderness, nasal deformity, septal deviation or nasal septal hematoma. No epistaxis.  No foreign bodies. Right sinus exhibits no maxillary sinus tenderness and no frontal sinus tenderness. Left sinus exhibits no maxillary sinus tenderness and no frontal sinus  tenderness.  Mouth/Throat: Uvula is midline, oropharynx is clear and moist and mucous membranes are normal. No oropharyngeal exudate. No tonsillar exudate.  Eyes: Right eye exhibits no discharge. Left eye exhibits no discharge. No scleral icterus.  Neck: Normal range of motion. Neck supple. No JVD present. No tracheal deviation present. No thyromegaly present.  Cardiovascular: Normal rate, regular rhythm and normal heart sounds.  Pulmonary/Chest: Effort normal. No stridor. No respiratory distress. She has no wheezes. She has no rales.  Diffuse rhonchi  Lymphadenopathy:    She has no cervical adenopathy.  Skin: She is not diaphoretic.  Nursing note and vitals reviewed.    UC Treatments / Results  Labs (all labs ordered are listed, but only abnormal results are displayed) Labs Reviewed - No data to display  EKG None  Radiology No results found.  Procedures Procedures (including critical care time)  Medications Ordered in UC Medications - No data to display  Initial Impression / Assessment and Plan / UC Course  I have reviewed the triage vital signs and the nursing notes.  Pertinent labs & imaging results that were available during my care of the patient were reviewed by me and considered in my medical decision making (see chart for details).      Final Clinical Impressions(s) / UC Diagnoses   Final diagnoses:  Cough  Acute bronchitis, unspecified organism    ED Prescriptions    Medication Sig Dispense Auth. Provider   azithromycin (ZITHROMAX Z-PAK) 250 MG tablet 2 tabs po once, then 1 tab po qd for next 4 days 6 each Payton Mccallum, MD     1. diagnosis reviewed with patient 2. rx as per orders above; reviewed possible side effects, interactions, risks and benefits  3. Recommend supportive treatment with otc cough med prn  4. Follow-up prn if symptoms worsen or don't improve    Controlled Substance Prescriptions [place] Controlled Substance Registry consulted? Not  Applicable   Payton Mccallum, MD 07/25/18 [phone]

## 2019-01-20 ENCOUNTER — Emergency Department: Payer: 59

## 2019-01-20 ENCOUNTER — Emergency Department
Admission: EM | Admit: 2019-01-20 | Discharge: 2019-01-20 | Disposition: A | Discharge status: Home / Self Care | Payer: 59 | Attending: Emergency Medicine | Admitting: Emergency Medicine

## 2019-01-20 DIAGNOSIS — S8992XA Unspecified injury of left lower leg, initial encounter: Secondary | ICD-10-CM | POA: Diagnosis present

## 2019-01-20 DIAGNOSIS — S2020XA Contusion of thorax, unspecified, initial encounter: Secondary | ICD-10-CM | POA: Diagnosis not present

## 2019-01-20 DIAGNOSIS — Y939 Activity, unspecified: Secondary | ICD-10-CM | POA: Diagnosis not present

## 2019-01-20 DIAGNOSIS — Y929 Unspecified place or not applicable: Secondary | ICD-10-CM | POA: Diagnosis not present

## 2019-01-20 DIAGNOSIS — S81812A Laceration without foreign body, left lower leg, initial encounter: Secondary | ICD-10-CM | POA: Insufficient documentation

## 2019-01-20 DIAGNOSIS — S0990XA Unspecified injury of head, initial encounter: Secondary | ICD-10-CM

## 2019-01-20 DIAGNOSIS — Z79899 Other long term (current) drug therapy: Secondary | ICD-10-CM | POA: Insufficient documentation

## 2019-01-20 DIAGNOSIS — Z23 Encounter for immunization: Secondary | ICD-10-CM | POA: Diagnosis not present

## 2019-01-20 DIAGNOSIS — Y999 Unspecified external cause status: Secondary | ICD-10-CM | POA: Diagnosis not present

## 2019-01-20 DIAGNOSIS — R55 Syncope and collapse: Secondary | ICD-10-CM | POA: Insufficient documentation

## 2019-01-20 LAB — POCT PREGNANCY, URINE: PREG TEST UR: NEGATIVE

## 2019-01-20 MED ORDER — TETANUS-DIPHTH-ACELL PERTUSSIS 5-2.5-18.5 LF-MCG/0.5 IM SUSP
0.5000 mL | Freq: Once | INTRAMUSCULAR | Status: AC
  Administered 2019-01-20: 0.5 mL via INTRAMUSCULAR
  Filled 2019-01-20: qty 0.5

## 2019-01-20 MED ORDER — ACETAMINOPHEN 325 MG PO TABS
650.0000 mg | ORAL_TABLET | Freq: Once | ORAL | Status: AC
  Administered 2019-01-20: 650 mg via ORAL
  Filled 2019-01-20: qty 2

## 2019-01-20 NOTE — ED Notes (Signed)
EDP at bedside at this time.  
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
## 2019-01-20 NOTE — ED Notes (Signed)
POC preg negative.
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
## 2020-02-20 ENCOUNTER — Other Ambulatory Visit: Payer: Self-pay

## 2020-02-20 ENCOUNTER — Ambulatory Visit
Admission: EM | Admit: 2020-02-20 | Discharge: 2020-02-20 | Disposition: A | Discharge status: Home / Self Care | Payer: Managed Care, Other (non HMO) | Attending: Emergency Medicine | Admitting: Emergency Medicine

## 2020-02-20 DIAGNOSIS — R519 Headache, unspecified: Secondary | ICD-10-CM

## 2020-02-20 MED ORDER — IBUPROFEN 800 MG PO TABS: 800.0000 mg | ORAL_TABLET | Freq: Three times a day (TID) | ORAL | 0 refills | Status: AC | PRN

## 2020-02-20 MED ORDER — KETOROLAC TROMETHAMINE 30 MG/ML IJ SOLN: 30.0000 mg | Freq: Once | INTRAMUSCULAR | Status: DC

## 2020-02-20 NOTE — Discharge Instructions (Signed)
You were given an injection of Toradol today for your headache.    Starting tomorrow, you can take the prescribed ibuprofen as directed.    Establish primary care provider as soon as possible.    Go to the emergency department if you have acute worsening symptoms.

## 2020-02-20 NOTE — ED Provider Notes (Signed)
Cathy Brandt    CSN: [id number] Arrival date & time: 02/20/20  1510      History   Chief Complaint Chief Complaint  Patient presents with  . Migraine    HPI Cathy Brandt is a 29 y.o. female.   Patient presents with bandlike headache x3 days.  She denies associated symptoms, including weakness, numbness, dizziness, fever chills, sore throat, congestion, rhinorrhea, chest pain, cough, shortness of breath, nausea, vomiting, or other symptoms.  She took Excedrin today with moderate relief; she previously had taken Tylenol without relief.  She denies pregnancy or breast-feeding.    The history is provided by the patient.    Past Medical History:  Diagnosis Date  . Anemia   . Medical history non-contributory     Patient Active Problem List   Diagnosis Date Noted  . 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 NO PAST SURGERIES      OB History    Gravida  2   Para  1   Term  1   Preterm      AB  1   Living  1     SAB  1   TAB      Ectopic      Multiple      Live Births  1            Home Medications    Prior to Admission medications   Medication Sig Start Date End Date Taking? Authorizing Provider  azithromycin (ZITHROMAX Z-PAK) 250 MG tablet 2 tabs po once, then 1 tab po qd for next 4 days 07/25/18   Payton Mccallum, MD  cephALEXin (KEFLEX) 500 MG capsule Take 1 capsule (500 mg total) by mouth 2 (two) times daily. 02/12/18   Payton Mccallum, MD  ferrous fumarate (HEMOCYTE - 106 MG FE) 325 (106 FE) MG TABS Take 1 tablet by mouth.    [provider]  ibuprofen (ADVIL) 800 MG tablet Take 1 tablet (800 mg total) by mouth every 8 (eight) hours as needed. 02/21/20   Mickie Bail, NP    Family History Family History  Problem Relation Age of Onset  . Diabetes Mother   . Healthy Father     Social History Social History   Tobacco Use  . Smoking status: Never Smoker  . Smokeless tobacco: Never Used    Substance Use Topics  . Alcohol use: Yes    Comment: rare  . Drug use: No     Allergies   Patient has no known allergies.   Review of Systems Review of Systems  Constitutional: Negative for chills and fever.  HENT: Negative for congestion, ear pain, rhinorrhea and sore throat.   Eyes: Negative for pain and visual disturbance.  Respiratory: Negative for cough and shortness of breath.   Cardiovascular: Negative for chest pain and palpitations.  Gastrointestinal: Negative for abdominal pain, diarrhea, nausea and vomiting.  Genitourinary: Negative for dysuria and hematuria.  Musculoskeletal: Negative for arthralgias and back pain.  Skin: Negative for color change and rash.  Neurological: Positive for headaches. Negative for dizziness, tremors, seizures, syncope, facial asymmetry, speech difficulty, weakness, light-headedness and numbness.  All other systems reviewed and are negative.    Physical Exam Triage Vital Signs ED Triage Vitals  Enc Vitals Group     BP      Pulse      Resp      Temp      Temp src  SpO2      Weight      Height      Head Circumference      Peak Flow      Pain Score      Pain Loc      Pain Edu?      Excl. in GC?    No data found.  Updated Vital Signs BP 100/71 (BP Location: Left Arm)   Pulse 94   Temp 98.8 F (37.1 C) (Oral)   Resp 18   Ht 5' 3.5" (1.613 m)   Wt 137 lb (62.1 kg)   LMP 02/20/2020   SpO2 98%   Breastfeeding No   BMI 23.89 kg/m   Visual Acuity Right Eye Distance:   Left Eye Distance:   Bilateral Distance:    Right Eye Near:   Left Eye Near:    Bilateral Near:     Physical Exam Vitals and nursing note reviewed.  Constitutional:      General: She is not in acute distress.    Appearance: She is well-developed. She is not ill-appearing.  HENT:     Head: Normocephalic and atraumatic.     Right Ear: Tympanic membrane normal.     Left Ear: Tympanic membrane normal.     Nose: Nose normal.     Mouth/Throat:      Mouth: Mucous membranes are moist.     Pharynx: Oropharynx is clear.  Eyes:     Extraocular Movements: Extraocular movements intact.     Conjunctiva/sclera: Conjunctivae normal.     Pupils: Pupils are equal, round, and reactive to light.  Cardiovascular:     Rate and Rhythm: Normal rate and regular rhythm.     Heart sounds: No murmur.  Pulmonary:     Effort: Pulmonary effort is normal. No respiratory distress.     Breath sounds: Normal breath sounds.  Abdominal:     General: Bowel sounds are normal.     Palpations: Abdomen is soft.     Tenderness: There is no abdominal tenderness. There is no guarding or rebound.  Musculoskeletal:        General: Normal range of motion.     Cervical back: Neck supple.  Skin:    General: Skin is warm and dry.     Findings: No rash.  Neurological:     General: No focal deficit present.     Mental Status: She is alert and oriented to person, place, and time.     Cranial Nerves: No cranial nerve deficit.     Sensory: No sensory deficit.     Motor: No weakness.     Coordination: Coordination normal.     Gait: Gait normal.  Psychiatric:        Mood and Affect: Mood normal.        Behavior: Behavior normal.      UC Treatments / Results  Labs (all labs ordered are listed, but only abnormal results are displayed) Labs Reviewed - No data to display  EKG   Radiology No results found.  Procedures Procedures (including critical care time)  Medications Ordered in UC Medications  ketorolac (TORADOL) 30 MG/ML injection 30 mg (has no administration in time range)    Initial Impression / Assessment and Plan / UC Course  I have reviewed the triage vital signs and the nursing notes.  Pertinent labs & imaging results that were available during my care of the patient were reviewed by me and considered in my medical decision making (  see chart for details).   Acute non-intractable headache.  Treated with Toradol today; can start ibuprofen  800 mg as needed tomorrow.  Discussed with patient the need to establish a PCP as soon as possible.  Instructed her to go to the emergency department if she has acute worsening symptoms.  Patient agrees to plan of care.      Final Clinical Impressions(s) / UC Diagnoses   Final diagnoses:  Acute nonintractable headache, unspecified headache type     Discharge Instructions     You were given an injection of Toradol today for your headache.    Starting tomorrow, you can take the prescribed ibuprofen as directed.    Establish primary care provider as soon as possible.    Go to the emergency department if you have acute worsening symptoms.        ED Prescriptions    Medication Sig Dispense Auth. Provider   ibuprofen (ADVIL) 800 MG tablet Take 1 tablet (800 mg total) by mouth every 8 (eight) hours as needed. 21 tablet Sharion Balloon, NP     I have reviewed the PDMP during this encounter.   Sharion Balloon, NP 02/20/20 [phone]

## 2020-02-20 NOTE — ED Triage Notes (Signed)
Patient states that she has been having a headache x 3 days. States that pain has been continuous. Patient reports that she took Excedrin today and has noticed some relief. States that pain is worse with head movement.

## 2020-04-28 ENCOUNTER — Ambulatory Visit: Payer: Medicaid Other | Attending: Internal Medicine

## 2020-04-28 DIAGNOSIS — Z23 Encounter for immunization: Secondary | ICD-10-CM

## 2020-04-28 NOTE — Progress Notes (Signed)
   Covid-19 Vaccination Clinic  Name:  Cathy Brandt    MRN: [id number] DOB: 01-18-1991  04/28/2020  [name] was observed post Covid-19 immunization for 15 minutes without incident. She was provided with Vaccine Information Sheet and instruction to access the V-Safe system.   [name] was instructed to call 911 with any severe reactions post vaccine: Marland Kitchen Difficulty breathing  . Swelling of face and throat  . A fast heartbeat  . A bad rash all over body  . Dizziness and weakness   Immunizations Administered    Name Date Dose VIS Date Route   Pfizer COVID-19 Vaccine 04/28/2020  9:14 AM 0.3 mL 02/12/2019 Intramuscular   Manufacturer: [employer], [employer]   Lot: [id number]   NDC: [address]-4

## 2020-05-19 ENCOUNTER — Ambulatory Visit: Payer: Medicaid Other | Attending: Internal Medicine

## 2020-05-19 DIAGNOSIS — Z23 Encounter for immunization: Secondary | ICD-10-CM

## 2020-05-19 NOTE — Progress Notes (Signed)
   Covid-19 Vaccination Clinic  Name:  Zakeria Kulzer    MRN: [id number] DOB: [birth year]  05/19/2020  Ms. Dick was observed post Covid-19 immunization for 15 minutes without incident. She was provided with Vaccine Information Sheet and instruction to access the V-Safe system.   Ms. Bateson was instructed to call 911 with any severe reactions post vaccine: Marland Kitchen Difficulty breathing  . Swelling of face and throat  . A fast heartbeat  . A bad rash all over body  . Dizziness and weakness   Immunizations Administered    Name Date Dose VIS Date Route   Pfizer COVID-19 Vaccine 05/19/2020 12:48 PM 0.3 mL 02/12/2019 Intramuscular   Manufacturer: [employer], [employer]   Lot: [id number]   NDC: [address]-9
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
